# Patient Record
Sex: Female | Born: 1970 | Race: White | Hispanic: No | Marital: Married | Smoking: Former smoker
Health system: Southern US, Community
[De-identification: ages and names within clinical notes are randomized; demographics above are authoritative.]

## PROBLEM LIST (undated history)

## (undated) DIAGNOSIS — E039 Hypothyroidism, unspecified: Secondary | ICD-10-CM

## (undated) DIAGNOSIS — F32A Depression, unspecified: Secondary | ICD-10-CM

## (undated) DIAGNOSIS — F329 Major depressive disorder, single episode, unspecified: Secondary | ICD-10-CM

## (undated) HISTORY — PX: BREAST ENHANCEMENT SURGERY: SHX7

---

## 2008-09-04 ENCOUNTER — Emergency Department (HOSPITAL_COMMUNITY): Admission: EM | Admit: 2008-09-04 | Discharge: 2008-09-04 | Payer: Self-pay | Admitting: Family Medicine

## 2010-04-23 ENCOUNTER — Ambulatory Visit: Payer: Self-pay | Admitting: Internal Medicine

## 2010-04-23 DIAGNOSIS — E039 Hypothyroidism, unspecified: Secondary | ICD-10-CM | POA: Insufficient documentation

## 2010-04-23 DIAGNOSIS — R5383 Other fatigue: Secondary | ICD-10-CM

## 2010-04-23 DIAGNOSIS — N926 Irregular menstruation, unspecified: Secondary | ICD-10-CM

## 2010-04-23 DIAGNOSIS — R5381 Other malaise: Secondary | ICD-10-CM

## 2010-04-23 LAB — CONVERTED CEMR LAB
ALT: 13 units/L (ref 0–35)
AST: 19 units/L (ref 0–37)
BUN: 12 mg/dL (ref 6–23)
CO2: 28 meq/L (ref 19–32)
Chloride: 104 meq/L (ref 96–112)
Creatinine, Ser: 0.8 mg/dL (ref 0.40–1.20)
Free T4: 1.05 ng/dL (ref 0.80–1.80)
HCT: 42.2 % (ref 36.0–46.0)
Hemoglobin: 13.8 g/dL (ref 12.0–15.0)
Potassium: 4 meq/L (ref 3.5–5.3)
RBC: 4.09 M/uL (ref 3.87–5.11)
TSH: 1.656 microintl units/mL (ref 0.350–4.500)
Thyroglobulin Ab: <20 (ref <40.0)
Thyroperoxidase Ab SerPl-aCnc: 15.3 (ref <35.0)
Total Protein: 7.2 g/dL (ref 6.0–8.3)
WBC: 6.8 10*3/uL (ref 4.0–10.5)

## 2010-04-24 ENCOUNTER — Encounter: Payer: Self-pay | Admitting: Internal Medicine

## 2010-04-24 LAB — CONVERTED CEMR LAB: Vitamin B-12: 511 pg/mL (ref 211–911)

## 2010-04-26 ENCOUNTER — Encounter: Payer: Self-pay | Admitting: Internal Medicine

## 2010-04-26 ENCOUNTER — Telehealth: Payer: Self-pay | Admitting: Internal Medicine

## 2010-07-24 NOTE — Letter (Signed)
   [place] [facility] [address] [place], [place]  [address]  [place] Phone: [phone]      April 26, 2010   KYANA AICHER [address] [facility] [place], [place] [address]  RE:  LAB RESULTS  Dear  Ms. Dewaine Conger,  The following is an interpretation of your most recent lab tests.  Please take note of any instructions provided or changes to medications that have resulted from your lab work.  ELECTROLYTES:  Good - no changes needed  KIDNEY FUNCTION TESTS:  Good - no changes needed  LIVER FUNCTION TESTS:  Good - no changes needed  THYROID STUDIES:  Thyroid studies normal, No medication change TSH: 1.656     CBC:  Good - no changes needed  B12 level - normal    I will further discuss your lab results at your next follow up appointment.     Sincerely Yours,    Dr. Thomos Lemons  Appended Document:  mailed

## 2010-07-24 NOTE — Progress Notes (Signed)
Summary: Lab Results  Phone Note Outgoing Call   Summary of Call: informed pt - thyroid antibodies are normal.  TSH is normal.   pt may have had transient thyroiditis in the past I rec pt taper off thyroid medication within 1 month. repeat TSH in 2 months  244.9 Initial call taken by: D. Thomos Lemons DO,  April 27, 2010 1:13 PM  Follow-up for Phone Call        called  placed to patient at [phone], no answer. A detailed voice message was left informing patient per Dr Artist Pais instruction. Message was left for patient to call if any questions Follow-up by: Glendell Docker CMA,  April 27, 2010 3:15 PM

## 2010-07-24 NOTE — Miscellaneous (Signed)
Summary: Vaccine Records/[facility]  Vaccine Records/[facility]   Imported By: Lanelle Bal 05/11/2010 10:19:48  _____________________________________________________________________  External Attachment:    Type:   Image     Comment:   External Document

## 2010-07-24 NOTE — Assessment & Plan Note (Signed)
Summary: new to est patient fasting tricare/[facility]   Vital Signs:  Patient profile:   40 year old female Height:      64 inches Weight:      136.75 pounds BMI:     23.56 O2 Sat:      100 % on Room air Temp:     97.8 degrees F oral Pulse rate:   63 / minute Pulse rhythm:   irregular Resp:     16 per minute BP sitting:   100 / 70  (left arm) Cuff size:   regular  Vitals Entered By: Glendell Docker CMA (April 23, 2010 9:45 AM)  O2 Flow:  Room air CC: New Patient  Is Patient Diabetic? No Pain Assessment Patient in pain? no      Comments establish care and monitor thyroid problem   Primary Care Provider:  Dondra Spry DO  CC:  New Patient .  History of Present Illness: 40 y/o white female to establish  Hypothyroidism diagnosed 2009 fatigue and sluggish, wt gain feeling better since thyroid medication still would like to lose additional wt  wt range - 122 - 140   goal wt 122 - 125 113 - 118 she exercises regularly - swim and run , kick boxing work outs are fairly vigorous - 7-9 in intensity monitors caloric intake - 1500 -1800 right now (2000) does not track protein intake french fries, chips  (salty fried) -  tends to "cheat" when husband is home  (he is freq deployed on [employer])  History of tob use -  1/2 ppd - 1 ppd  (21 yrs) (no chronic cough)  2010 - experienced emotional lability.  Prev PCP attributed to mild depression.  stress of having husband in [employer].  citalopram caused her to feel flat.  doing well on sertraline.  no side effects noted  she has used phentermine as needed in the past for wt loss pt unable to sustain wt loss   Preventive Screening-Counseling & Management  Alcohol-Tobacco     Alcohol drinks/day: 1     Smoking Status: quit     Packs/Day: 1.0     Year Started: 1990     Year Quit: 2011  Caffeine-Diet-Exercise     Caffeine use/day: 4 beverages daily     Does Patient Exercise: yes     Times/week: 4  Allergies  (verified): No Known Drug Allergies  Past History:  Past Medical History: Hypothyroidism - diagnosed in 2009 Hx of heart arrhythmia - S/P Ablation 1999  Past Surgical History: Denies surgical history  Family History: Family History of Colon CA - MGM (diagnosed in her 50's) Family History Diabetes 1st degree relative - father (obese) Family History High cholesterol - mother Family History Hypertension -father Family History Lung cancer - MGM Family History of Stroke M 1st degree relative <50 Aunt - breast cancer  Social History: Married 17 years 3 sons 96, 75, 37  Former smoker - Quit 2011 Alcohol use-yes (social) [place] met Josh at [employer] Smoking Status:  quit Packs/Day:  1.0 Caffeine use/day:  4 beverages daily Does Patient Exercise:  yes  Review of Systems  The patient denies anorexia, fever, weight gain, chest pain, syncope, dyspnea on exertion, prolonged cough, abdominal pain, melena, hematochezia, severe indigestion/heartburn, and depression.         emotional lability over 1 yr,  menstrual cycle somewhat erratic.  sertraline 2010 - (tried citalopram)  Physical Exam  General:  alert, well-developed, and well-nourished.   Head:  normocephalic and atraumatic.   Eyes:  pupils equal, pupils round, and pupils reactive to light.   Ears:  R ear normal and L ear normal.   Mouth:  pharynx pink and moist.   Neck:  No deformities, masses, or tenderness noted.no thyromegaly and no thyroid nodules or tenderness.   Lungs:  normal respiratory effort and normal breath sounds.   Heart:  normal rate, regular rhythm, and no gallop.   Abdomen:  soft, non-tender, normal bowel sounds, no masses, no hepatomegaly, and no splenomegaly.   Extremities:  No lower extremity edema  Neurologic:  cranial nerves II-XII intact and gait normal.   Psych:  normally interactive, good eye contact, not anxious appearing, and not depressed appearing.     Impression &  Recommendations:  Problem # 1:  HYPOTHYROIDISM (ICD-244.9) hx of hypothyroidism.  check thyroid antibodies.      Orders: T-TSH [phone]) T-T4, Free [phone]) T- * Misc. Laboratory test [phone])  Problem # 2:  PREVENTIVE HEALTH CARE (ICD-V70.0)  Orders: Mammogram (Screening) (Mammo) T-CBC No Diff ([id number])  Pap smear: normal (04/08/2006) Td Booster: Historical (04/12/2008)   Flu Vax: Historical (03/13/2010)     Problem # 3:  IRREGULAR MENSES (ICD-626.4) I doubt pt peri menopausal.  irregular periods likely assoc with vigorous exercise  Orders: T-FSH ([id number]) T-LH ([id number])  Complete Medication List: 1)  Sertraline Hcl 50 Mg Tabs (Sertraline hcl) .... Take 1 tablet by mouth once a day 2)  Phentermine Hcl 15 Mg Caps (Phentermine hcl) .... Take 1 capsule by mouth once a day 3)  B Complex Vitamins Caps (B complex vitamins) .... Take 1 capsule by mouth once a day  Other Orders: T-Basic Metabolic Panel [phone]) T-Hepatic Function [phone])  Patient Instructions: 1)  Please schedule PAP / Pelvic within next 1-2 months 2)  Please schedule a follow-up appointment in 3 months.   Orders Added: 1)  Mammogram (Screening) [Mammo] 2)  T-TSH [[id number]] 3)  T-T4, Free [[id number]] 4)  T- * Misc. Laboratory test [99999] 5)  T-CBC No Diff [[id number]] 6)  T-Basic Metabolic Panel [[id number]] 7)  T-Hepatic Function [[id number]] 8)  T-FSH [[id number]] 9)  T-LH [[id number]] 10)  New Patient Level III [10932]   Immunization History:  Influenza Immunization History:    Influenza:  historical (03/13/2010)   Immunization History:  Influenza Immunization History:    Influenza:  Historical (03/13/2010)   Preventive Care Screening  Last Tetanus Booster:    Date:  04/12/2008    Results:  Historical   Pap Smear:    Date:  04/08/2006    Results:  normal    Current Allergies (reviewed today): No known allergies

## 2011-07-13 ENCOUNTER — Encounter (HOSPITAL_BASED_OUTPATIENT_CLINIC_OR_DEPARTMENT_OTHER): Payer: Self-pay

## 2011-07-13 ENCOUNTER — Emergency Department (INDEPENDENT_AMBULATORY_CARE_PROVIDER_SITE_OTHER)

## 2011-07-13 ENCOUNTER — Emergency Department (HOSPITAL_BASED_OUTPATIENT_CLINIC_OR_DEPARTMENT_OTHER)
Admission: EM | Admit: 2011-07-13 | Discharge: 2011-07-13 | Disposition: A | Discharge status: Home / Self Care | Attending: Emergency Medicine | Admitting: Emergency Medicine

## 2011-07-13 DIAGNOSIS — Z79899 Other long term (current) drug therapy: Secondary | ICD-10-CM | POA: Insufficient documentation

## 2011-07-13 DIAGNOSIS — S139XXA Sprain of joints and ligaments of unspecified parts of neck, initial encounter: Secondary | ICD-10-CM | POA: Insufficient documentation

## 2011-07-13 DIAGNOSIS — E039 Hypothyroidism, unspecified: Secondary | ICD-10-CM | POA: Insufficient documentation

## 2011-07-13 DIAGNOSIS — T07XXXA Unspecified multiple injuries, initial encounter: Secondary | ICD-10-CM

## 2011-07-13 DIAGNOSIS — IMO0002 Reserved for concepts with insufficient information to code with codable children: Secondary | ICD-10-CM | POA: Insufficient documentation

## 2011-07-13 DIAGNOSIS — W19XXXA Unspecified fall, initial encounter: Secondary | ICD-10-CM

## 2011-07-13 DIAGNOSIS — S0510XA Contusion of eyeball and orbital tissues, unspecified eye, initial encounter: Secondary | ICD-10-CM

## 2011-07-13 DIAGNOSIS — F3289 Other specified depressive episodes: Secondary | ICD-10-CM | POA: Insufficient documentation

## 2011-07-13 DIAGNOSIS — W010XXA Fall on same level from slipping, tripping and stumbling without subsequent striking against object, initial encounter: Secondary | ICD-10-CM | POA: Insufficient documentation

## 2011-07-13 DIAGNOSIS — Y998 Other external cause status: Secondary | ICD-10-CM | POA: Insufficient documentation

## 2011-07-13 DIAGNOSIS — F329 Major depressive disorder, single episode, unspecified: Secondary | ICD-10-CM | POA: Insufficient documentation

## 2011-07-13 DIAGNOSIS — S161XXA Strain of muscle, fascia and tendon at neck level, initial encounter: Secondary | ICD-10-CM

## 2011-07-13 HISTORY — DX: Depression, unspecified: F32.A

## 2011-07-13 HISTORY — DX: Hypothyroidism, unspecified: E03.9

## 2011-07-13 HISTORY — DX: Major depressive disorder, single episode, unspecified: F32.9

## 2011-07-13 MED ORDER — TETANUS-DIPHTH-ACELL PERTUSSIS 5-2.5-18.5 LF-MCG/0.5 IM SUSP
0.5000 mL | Freq: Once | INTRAMUSCULAR | Status: AC
  Administered 2011-07-13: 0.5 mL via INTRAMUSCULAR
  Filled 2011-07-13: qty 0.5

## 2011-07-13 MED ORDER — CARISOPRODOL 350 MG PO TABS: 350.0000 mg | ORAL_TABLET | Freq: Three times a day (TID) | ORAL | Status: AC

## 2011-07-13 MED ORDER — IBUPROFEN 800 MG PO TABS
800.0000 mg | ORAL_TABLET | Freq: Once | ORAL | Status: AC
  Administered 2011-07-13: 800 mg via ORAL
  Filled 2011-07-13: qty 1

## 2011-07-13 MED ORDER — HYDROCODONE-ACETAMINOPHEN 5-500 MG PO TABS: 1.0000 | ORAL_TABLET | Freq: Four times a day (QID) | ORAL | Status: AC | PRN

## 2011-07-13 NOTE — ED Provider Notes (Signed)
History     CSN: [id number]  Arrival date & time 07/13/11  1016   First MD Initiated Contact with Patient 07/13/11 1134      Chief Complaint  Patient presents with  . Fall    (Consider location/radiation/quality/duration/timing/severity/associated sxs/prior treatment) HPI Comments: Patient presents to the emergency department after sustaining a fall last night. She states that she had been drinking heavily she had 3 glasses of wine. She was walking outside and slipped on some ice. She states that she fell forward into some gravel. Although she did not remember the entire incident. She does think that she passed out. Complains of pain primarily to the right side of her neck. Denies any numbness or weakness in her extremities. Denies any radiation down her arm. Denies any bony tenderness her face. Denies vision changes. Denies any other extremity injuries. She states that the pain in her neck is worse when turning her head to the right.  The history is provided by the patient.    Past Medical History  Diagnosis Date  . Hypothyroid   . Depression     History reviewed. No pertinent past surgical history.  No family history on file.  History  Substance Use Topics  . Smoking status: Former Games developer  . Smokeless tobacco: Not on file  . Alcohol Use: Yes    OB History    Grav Para Term Preterm Abortions TAB SAB Ect Mult Living                  Review of Systems  Constitutional: Negative for fever, chills, diaphoresis and fatigue.  HENT: Negative for congestion, rhinorrhea and sneezing.   Eyes: Negative.  Negative for pain, redness and visual disturbance.  Respiratory: Negative for cough, chest tightness and shortness of breath.   Cardiovascular: Negative for chest pain and leg swelling.  Gastrointestinal: Negative for nausea, vomiting, abdominal pain, diarrhea and blood in stool.  Genitourinary: Negative for frequency, hematuria, flank pain and difficulty urinating.    Musculoskeletal: Negative for back pain and arthralgias.  Skin: Positive for wound. Negative for rash.  Neurological: Negative for dizziness, speech difficulty, weakness, numbness and headaches.    Allergies  Review of patient's allergies indicates no known allergies.  Home Medications   Current Outpatient Rx  Name Route Sig Dispense Refill  . VITAMIN B 12 PO Oral Take by mouth.    Marland Kitchen LEVOTHYROXINE SODIUM PO Oral Take by mouth.    . SERTRALINE HCL PO Oral Take by mouth.    Marland Kitchen CARISOPRODOL 350 MG PO TABS Oral Take 1 tablet (350 mg total) by mouth 3 (three) times daily. 15 tablet 0  . HYDROCODONE-ACETAMINOPHEN 5-500 MG PO TABS Oral Take 1-2 tablets by mouth every 6 (six) hours as needed for pain. 15 tablet 0    BP 113/74  Pulse 77  Temp(Src) 98 F (36.7 C) (Oral)  Resp 18  Ht 5\' 4"  (1.626 m)  Wt 131 lb (59.421 kg)  BMI 22.49 kg/m2  SpO2 100%  LMP 07/12/2011  Physical Exam  Constitutional: She is oriented to person, place, and time. She appears well-developed and well-nourished.  HENT:  Head: Normocephalic.  Right Ear: External ear normal.  Left Ear: External ear normal.  Nose: Nose normal.  Mouth/Throat: Oropharynx is clear and moist.       Patient has multiple abrasions to the right side of her face. There is no lacerations that need suturing. Extraocular eye movements are intact. No redness or apparent ocular injuries are noted.  No bleeding from the nares is noted. There is no bony tenderness on palpation of the face. The teeth appear intact.  Eyes: Pupils are equal, round, and reactive to light.  Neck: Normal range of motion. Neck supple.  Cardiovascular: Normal rate, regular rhythm and normal heart sounds.   Pulmonary/Chest: Effort normal and breath sounds normal. No respiratory distress. She has no wheezes. She has no rales. She exhibits no tenderness.  Abdominal: Soft. Bowel sounds are normal. There is no tenderness. There is no rebound and no guarding.   Musculoskeletal: Normal range of motion. She exhibits no edema.       There is positive tenderness to palpation to the right trapezius muscle and along the cervical spine at the midline. No step-offs or deformities are noted. There is no pain to the thoracic or lumbosacral spine. There is no bony tenderness noted to the extremities  Lymphadenopathy:    She has no cervical adenopathy.  Neurological: She is alert and oriented to person, place, and time.  Skin: Skin is warm and dry. No rash noted.  Psychiatric: She has a normal mood and affect.    ED Course  Procedures (including critical care time)  Labs Reviewed - No data to display Ct Head Wo Contrast  07/13/2011  *RADIOLOGY REPORT*  Clinical Data:  Fall, bruising around right eye, abrasions to right head  CT HEAD WITHOUT CONTRAST CT CERVICAL SPINE WITHOUT CONTRAST  Technique:  Multidetector CT imaging of the head and cervical spine was performed following the standard protocol without intravenous contrast.  Multiplanar CT image reconstructions of the cervical spine were also generated.  Comparison:  None  CT HEAD  Findings: No evidence of parenchymal hemorrhage or extra-axial fluid collection. No mass lesion, mass effect, or midline shift.  No CT evidence of acute infarction.  Cerebral volume is age appropriate.  No ventriculomegaly.  The visualized paranasal sinuses are essentially clear. The mastoid air cells are unopacified.  No evidence of calvarial fracture.  IMPRESSION: No evidence of acute intracranial abnormality.  CT CERVICAL SPINE  Findings: Mild straightening of the cervical spine, likely positional.  No evidence of fracture or dislocation.  Vertebral body heights and intervertebral disc spaces are maintained.  The dens appears intact.  No prevertebral soft tissue swelling.  Visualized thyroid is unremarkable.  IMPRESSION: No evidence of traumatic injury to the cervical spine.  Original Report Authenticated By: Charline Bills, M.D.    Ct Cervical Spine Wo Contrast  07/13/2011  *RADIOLOGY REPORT*  Clinical Data:  Fall, bruising around right eye, abrasions to right head  CT HEAD WITHOUT CONTRAST CT CERVICAL SPINE WITHOUT CONTRAST  Technique:  Multidetector CT imaging of the head and cervical spine was performed following the standard protocol without intravenous contrast.  Multiplanar CT image reconstructions of the cervical spine were also generated.  Comparison:  None  CT HEAD  Findings: No evidence of parenchymal hemorrhage or extra-axial fluid collection. No mass lesion, mass effect, or midline shift.  No CT evidence of acute infarction.  Cerebral volume is age appropriate.  No ventriculomegaly.  The visualized paranasal sinuses are essentially clear. The mastoid air cells are unopacified.  No evidence of calvarial fracture.  IMPRESSION: No evidence of acute intracranial abnormality.  CT CERVICAL SPINE  Findings: Mild straightening of the cervical spine, likely positional.  No evidence of fracture or dislocation.  Vertebral body heights and intervertebral disc spaces are maintained.  The dens appears intact.  No prevertebral soft tissue swelling.  Visualized thyroid is unremarkable.  IMPRESSION: No evidence of traumatic injury to the cervical spine.  Original Report Authenticated By: Charline Bills, M.D.     1. Cervical strain   2. Abrasions of multiple sites       MDM  Patient had no evidence of fracture or to the spine. No bony tenderness to the face to indicate fracture. No intracranial injuries noted. Will give prescriptions for symptomatic treatment of the muscle strain. Given wound care instructions for the abrasions. Her tetanus shot was updated. We'll followup with her primary care provider as needed.        Rolan Bucco, MD 07/13/11 [phone]

## 2011-07-13 NOTE — ED Notes (Signed)
Pt c/o fall last evening.  Pt states she slipped outside and fell, striking R side of face.  Pt unaware of LOC.  Tetanus unknown.  Denies loose teeth.

## 2012-01-25 ENCOUNTER — Encounter (HOSPITAL_BASED_OUTPATIENT_CLINIC_OR_DEPARTMENT_OTHER): Payer: Self-pay | Admitting: Emergency Medicine

## 2012-01-25 ENCOUNTER — Emergency Department (HOSPITAL_BASED_OUTPATIENT_CLINIC_OR_DEPARTMENT_OTHER)

## 2012-01-25 ENCOUNTER — Emergency Department (HOSPITAL_BASED_OUTPATIENT_CLINIC_OR_DEPARTMENT_OTHER)
Admission: EM | Admit: 2012-01-25 | Discharge: 2012-01-25 | Disposition: A | Discharge status: Home / Self Care | Attending: Emergency Medicine | Admitting: Emergency Medicine

## 2012-01-25 DIAGNOSIS — E039 Hypothyroidism, unspecified: Secondary | ICD-10-CM | POA: Insufficient documentation

## 2012-01-25 DIAGNOSIS — F329 Major depressive disorder, single episode, unspecified: Secondary | ICD-10-CM | POA: Insufficient documentation

## 2012-01-25 DIAGNOSIS — F3289 Other specified depressive episodes: Secondary | ICD-10-CM | POA: Insufficient documentation

## 2012-01-25 DIAGNOSIS — F17200 Nicotine dependence, unspecified, uncomplicated: Secondary | ICD-10-CM | POA: Insufficient documentation

## 2012-01-25 DIAGNOSIS — R079 Chest pain, unspecified: Secondary | ICD-10-CM

## 2012-01-25 LAB — URINALYSIS, ROUTINE W REFLEX MICROSCOPIC
Hgb urine dipstick: NEGATIVE
Leukocytes, UA: NEGATIVE
Nitrite: NEGATIVE
Protein, ur: NEGATIVE mg/dL
Specific Gravity, Urine: 1.004 — ABNORMAL LOW (ref 1.005–1.030)
Urobilinogen, UA: 0.2 mg/dL (ref 0.0–1.0)

## 2012-01-25 LAB — COMPREHENSIVE METABOLIC PANEL
AST: 23 U/L (ref 0–37)
BUN: 11 mg/dL (ref 6–23)
CO2: 24 mEq/L (ref 19–32)
Calcium: 8.6 mg/dL (ref 8.4–10.5)
Chloride: 106 mEq/L (ref 96–112)
Creatinine, Ser: 0.8 mg/dL (ref 0.50–1.10)
GFR calc Af Amer: >90 mL/min (ref >90)
GFR calc non Af Amer: >90 mL/min (ref >90)
Glucose, Bld: 99 mg/dL (ref 70–99)
Total Bilirubin: 0.5 mg/dL (ref 0.3–1.2)

## 2012-01-25 LAB — CBC WITH DIFFERENTIAL/PLATELET
Basophils Absolute: 0 10*3/uL (ref 0.0–0.1)
Eosinophils Relative: 0 % (ref 0–5)
HCT: 35.5 % — ABNORMAL LOW (ref 36.0–46.0)
Hemoglobin: 12.3 g/dL (ref 12.0–15.0)
Lymphocytes Relative: 34 % (ref 12–46)
MCHC: 34.6 g/dL (ref 30.0–36.0)
MCV: 98.6 fL (ref 78.0–100.0)
Monocytes Absolute: 0.6 10*3/uL (ref 0.1–1.0)
Monocytes Relative: 8 % (ref 3–12)
Neutro Abs: 4.1 10*3/uL (ref 1.7–7.7)
RDW: 11.6 % (ref 11.5–15.5)
WBC: 7.1 10*3/uL (ref 4.0–10.5)

## 2012-01-25 LAB — TROPONIN I: Troponin I: <0.3 ng/mL (ref <0.30)

## 2012-01-25 LAB — PREGNANCY, URINE: Preg Test, Ur: NEGATIVE

## 2012-01-25 MED ORDER — HYDROCODONE-ACETAMINOPHEN 5-325 MG PO TABS: 1.0000 | ORAL_TABLET | ORAL | Status: AC | PRN

## 2012-01-25 MED ORDER — IOHEXOL 350 MG/ML SOLN
100.0000 mL | Freq: Once | INTRAVENOUS | Status: AC | PRN
  Administered 2012-01-25: 100 mL via INTRAVENOUS

## 2012-01-25 MED ORDER — SODIUM CHLORIDE 0.9 % IV SOLN
INTRAVENOUS | Status: DC
  Administered 2012-01-25: 11:00:00 via INTRAVENOUS

## 2012-01-25 MED ORDER — CYCLOBENZAPRINE HCL 10 MG PO TABS: 10.0000 mg | ORAL_TABLET | Freq: Three times a day (TID) | ORAL | Status: AC | PRN

## 2012-01-25 NOTE — ED Notes (Signed)
Pt c/o pain to LT rib area w/ inspiration & movement; no known injury; no other sx

## 2012-01-25 NOTE — ED Provider Notes (Signed)
History     CSN: [id number]  Arrival date & time 01/25/12  4098   First MD Initiated Contact with Patient 01/25/12 [phone]      Chief Complaint  Patient presents with  . Chest Pain    (Consider location/radiation/quality/duration/timing/severity/associated sxs/prior treatment) HPI Comments: The patient is a 41 year old woman who woke up with a pain in her left lateral chest wall. The pain is worsened with a deep breath. She tried aspirin and Gas-X without relief. She was all right yesterday. There was no injury. She's had no fever cough she's had no prior similar episodes. She does smoke occasionally. She uses the NuvaRing for birth control.  Patient is a 41 y.o. female presenting with chest pain. The history is provided by the patient. No language interpreter was used.  Chest Pain The chest pain began 1 - 2 hours ago. Episode frequency: The pain is constantly present but accentuated by taking a deep breath. The chest pain is unchanged. The pain is associated with breathing and coughing. The severity of the pain is moderate. The quality of the pain is described as sharp. The pain does not radiate. Chest pain is worsened by deep breathing. Pertinent negatives for primary symptoms include no fever, no shortness of breath and no cough.  Associated symptoms include lower extremity edema. She tried aspirin and antacids for the symptoms. Risk factors include hormone replacement therapy. Past medical history comments: Hypothyroidism.     Past Medical History  Diagnosis Date  . Hypothyroid   . Depression     Past Surgical History  Procedure Date  . Breast enhancement surgery     No family history on file.  History  Substance Use Topics  . Smoking status: Current Some Day Smoker  . Smokeless tobacco: Not on file  . Alcohol Use: No    OB History    Grav Para Term Preterm Abortions TAB SAB Ect Mult Living                  Review of Systems  Constitutional: Negative for fever and  chills.  HENT: Negative.   Eyes: Negative.   Respiratory: Negative for cough and shortness of breath.   Cardiovascular: Positive for chest pain. Negative for leg swelling.  Gastrointestinal: Negative.   Genitourinary: Negative.   Musculoskeletal: Negative.   Skin: Negative.   Neurological: Negative.   Psychiatric/Behavioral: Negative.     Allergies  Review of patient's allergies indicates no known allergies.  Home Medications   Current Outpatient Rx  Name Route Sig Dispense Refill  . VITAMIN B 12 PO Oral Take by mouth.    Marland Kitchen LEVOTHYROXINE SODIUM PO Oral Take by mouth.    . SERTRALINE HCL PO Oral Take by mouth.      BP 123/66  Pulse 79  Temp 98 F (36.7 C) (Oral)  Resp 18  SpO2 100%  LMP 12/25/2011  Physical Exam  Nursing note and vitals reviewed. Constitutional: She is oriented to person, place, and time. She appears well-developed and well-nourished.       She has moderate distress when she takes a deep breath, with pain felt in the left lateral chest wall.  HENT:  Head: Normocephalic and atraumatic.  Right Ear: External ear normal.  Left Ear: External ear normal.  Mouth/Throat: Oropharynx is clear and moist.  Eyes: Conjunctivae and EOM are normal. Pupils are equal, round, and reactive to light.  Neck: Normal range of motion. Neck supple.  Cardiovascular: Normal rate, regular rhythm and normal heart  sounds.   Pulmonary/Chest: Effort normal and breath sounds normal. She has no wheezes. She has no rales. She exhibits no tenderness.  Abdominal: Soft. Bowel sounds are normal.  Musculoskeletal: Normal range of motion. She exhibits no edema and no tenderness.  Neurological: She is alert and oriented to person, place, and time.       No sensory or motor deficit.  Skin: Skin is warm and dry.  Psychiatric: She has a normal mood and affect. Her behavior is normal.    ED Course  Procedures (including critical care time)  Labs Reviewed - No data to display Dg Chest 2  View  01/25/2012  *RADIOLOGY REPORT*  Clinical Data: Chest pain.  CHEST - 2 VIEW  Comparison: No priors.  Findings: Lung volumes are normal.  No consolidative airspace disease.  No pleural effusions.  No pneumothorax.  No pulmonary nodule or mass noted.  Pulmonary vasculature and the cardiomediastinal silhouette are within normal limits.  Bilateral breast implants are incidentally noted.  IMPRESSION: 1. No radiographic evidence of acute cardiopulmonary disease.  Original Report Authenticated By: Florencia Reasons, M.D.   10:32 AM Chest x-ry negative.  Will do lab work, EKG, and CT angio of chest, where she uses the [place].  10:52 AM  Date: 01/25/2012  Rate: 58  Rhythm: normal sinus rhythm  QRS Axis: normal  Intervals: normal  ST/T Wave abnormalities: normal  Conduction Disutrbances:none  Narrative Interpretation: Normal EKG  Old EKG Reviewed: none available  12:26 PM Results for orders placed during the hospital encounter of 01/25/12  CBC WITH DIFFERENTIAL      Component Value Range   WBC 7.1  4.0 - 10.5 K/uL   RBC 3.60 (*) 3.87 - 5.11 MIL/uL   Hemoglobin 12.3  12.0 - 15.0 g/dL   HCT 78.2 (*) 95.6 - 21.3 %   MCV 98.6  78.0 - 100.0 fL   MCH 34.2 (*) 26.0 - 34.0 pg   MCHC 34.6  30.0 - 36.0 g/dL   RDW 08.6  57.8 - 46.9 %   Platelets 192  150 - 400 K/uL   Neutrophils Relative 57  43 - 77 %   Neutro Abs 4.1  1.7 - 7.7 K/uL   Lymphocytes Relative 34  12 - 46 %   Lymphs Abs 2.4  0.7 - 4.0 K/uL   Monocytes Relative 8  3 - 12 %   Monocytes Absolute 0.6  0.1 - 1.0 K/uL   Eosinophils Relative 0  0 - 5 %   Eosinophils Absolute 0.0  0.0 - 0.7 K/uL   Basophils Relative 0  0 - 1 %   Basophils Absolute 0.0  0.0 - 0.1 K/uL  COMPREHENSIVE METABOLIC PANEL      Component Value Range   Sodium 139  135 - 145 mEq/L   Potassium 5.0  3.5 - 5.1 mEq/L   Chloride 106  96 - 112 mEq/L   CO2 24  19 - 32 mEq/L   Glucose, Bld 99  70 - 99 mg/dL   BUN 11  6 - 23 mg/dL   Creatinine, Ser 6.29  0.50 -  1.10 mg/dL   Calcium 8.6  8.4 - 52.8 mg/dL   Total Protein 6.6  6.0 - 8.3 g/dL   Albumin 3.4 (*) 3.5 - 5.2 g/dL   AST 23  0 - 37 U/L   ALT 16  0 - 35 U/L   Alkaline Phosphatase 37 (*) 39 - 117 U/L   Total Bilirubin 0.5  0.3 -  1.2 mg/dL   GFR calc non Af Amer >90  >90 mL/min   GFR calc Af Amer >90  >90 mL/min  URINALYSIS, ROUTINE W REFLEX MICROSCOPIC      Component Value Range   Color, Urine YELLOW  YELLOW   APPearance CLEAR  CLEAR   Specific Gravity, Urine 1.004 (*) 1.005 - 1.030   pH 7.0  5.0 - 8.0   Glucose, UA NEGATIVE  NEGATIVE mg/dL   Hgb urine dipstick NEGATIVE  NEGATIVE   Bilirubin Urine NEGATIVE  NEGATIVE   Ketones, ur NEGATIVE  NEGATIVE mg/dL   Protein, ur NEGATIVE  NEGATIVE mg/dL   Urobilinogen, UA 0.2  0.0 - 1.0 mg/dL   Nitrite NEGATIVE  NEGATIVE   Leukocytes, UA NEGATIVE  NEGATIVE  PREGNANCY, URINE      Component Value Range   Preg Test, Ur NEGATIVE  NEGATIVE  TROPONIN I      Component Value Range   Troponin I <0.30  <0.30 ng/mL   Dg Chest 2 View  01/25/2012  *RADIOLOGY REPORT*  Clinical Data: Chest pain.  CHEST - 2 VIEW  Comparison: No priors.  Findings: Lung volumes are normal.  No consolidative airspace disease.  No pleural effusions.  No pneumothorax.  No pulmonary nodule or mass noted.  Pulmonary vasculature and the cardiomediastinal silhouette are within normal limits.  Bilateral breast implants are incidentally noted.  IMPRESSION: 1. No radiographic evidence of acute cardiopulmonary disease.  Original Report Authenticated By: Florencia Reasons, M.D.   Ct Angio Chest W/cm &/or Wo Cm  01/25/2012  *RADIOLOGY REPORT*  Clinical Data: Sudden onset left chest pain, pleuritic  CT ANGIOGRAPHY CHEST  Technique:  Multidetector CT imaging of the chest using the standard protocol during bolus administration of intravenous contrast. Multiplanar reconstructed images including MIPs were obtained and reviewed to evaluate the vascular anatomy.  Contrast: OMNIPAQUE IOHEXOL 350  MG/ML SOLN  Comparison: Chest radiographs dated 01/25/2012  Findings: No evidence of pulmonary embolism.  5 mm right upper lobe pure ground-glass nodule (series 6/image 26). Lungs are otherwise clear. No pleural effusion or pneumothorax.  Visualized thyroid is unremarkable.  The heart is normal in size.  No pericardial effusion.  No suspicious mediastinal, hilar, or axillary lymphadenopathy.  Visualized upper abdomen is unremarkable.  Bilateral breast augmentation.  Very mild degenerative changes of the visualized thoracolumbar spine.  IMPRESSION: No evidence of pulmonary embolism.  5 mm right upper lobe pure ground-glass nodule, likely benign.  No dedicated follow-up imaging is required per Fleischner Society guidelines.  No evidence of acute cardiopulmonary disease.  Above recommendation follows the consensus statement: Recommendations for the Management of Subsolid Pulmonary Nodules Detected at CT:  A Statement from the Fleischner Society. Radiology 2013; 266:304-317.  Original Report Authenticated By: Charline Bills, M.D.    12:27 PM Lab tests, EKG, and CT angio of chest are negative.  Rx for hydrocodone-acetaminophen, advised pt she needs to stop smoking.    1. Nonspecific chest pain          Carleene Cooper III, MD 01/25/12 [phone]

## 2012-12-03 ENCOUNTER — Other Ambulatory Visit: Payer: Self-pay | Admitting: Family Medicine

## 2012-12-03 DIAGNOSIS — Z9889 Other specified postprocedural states: Secondary | ICD-10-CM

## 2012-12-04 ENCOUNTER — Ambulatory Visit
Admission: RE | Admit: 2012-12-04 | Discharge: 2012-12-04 | Disposition: A | Discharge status: Home / Self Care | Source: Ambulatory Visit | Attending: Family Medicine | Admitting: Family Medicine

## 2012-12-04 DIAGNOSIS — Z8719 Personal history of other diseases of the digestive system: Secondary | ICD-10-CM

## 2013-11-29 IMAGING — RF DG ESOPHAGUS
10 series · 20 of 24 positions shown · non-contrast
Comparison: None.

CLINICAL DATA: Possible esophageal narrowing, sensation of food
sticking in throat

ESOPHAGUS/BARIUM SWALLOW/TABLET STUDY
Fluoroscopy Time: 1 minute 12 seconds

[Series 1: run · 2 of 6 slices shown (1 of 10)]
[im 1/6]
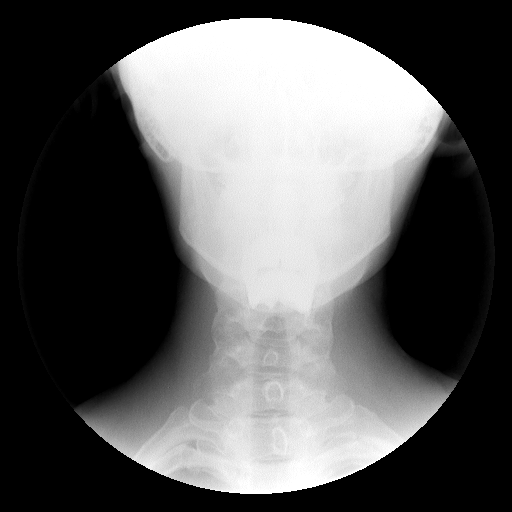
[im 3/6]
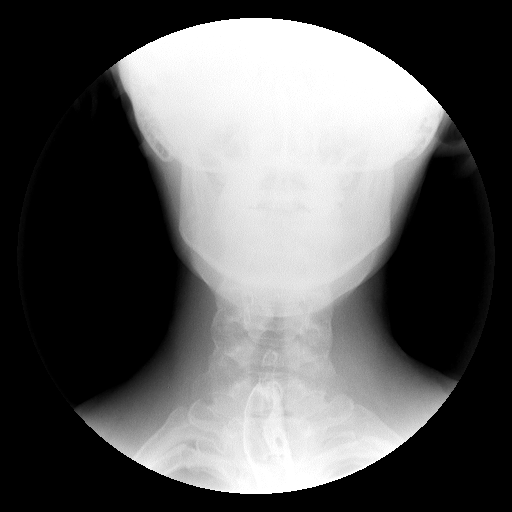

[Series 2: run · 2 of 5 slices shown (2 of 10)]
[im 1/5]
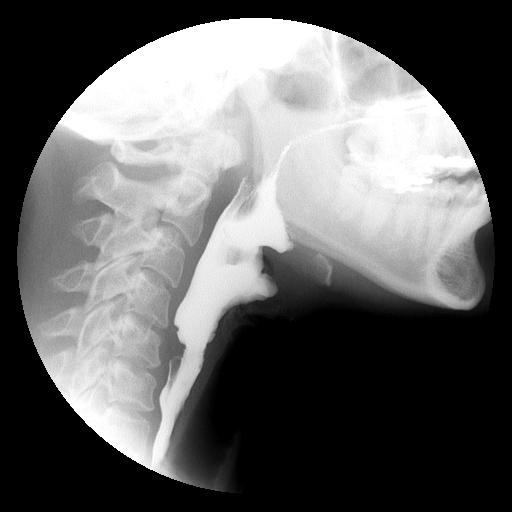
[im 5/5]
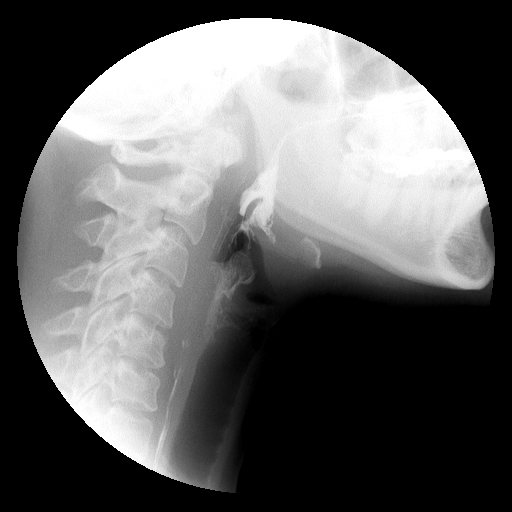

[Series 3: run · 3 of 8 slices shown (3 of 10)]
[im 1/8]
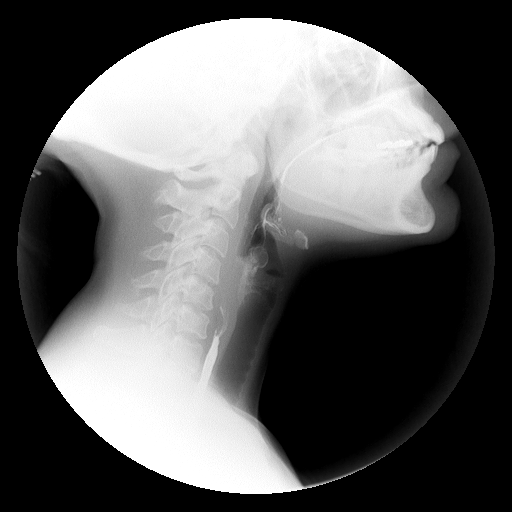
[im 4/8]
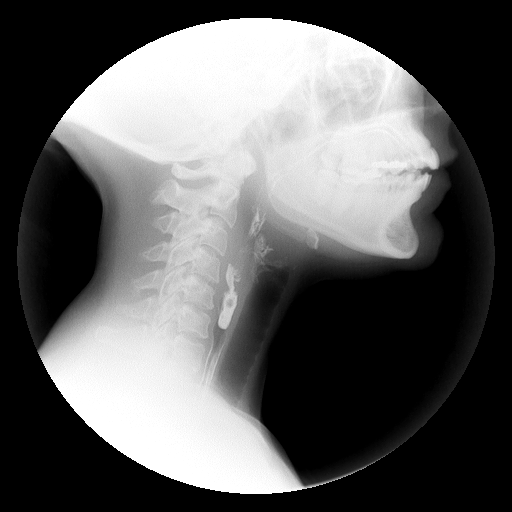
[im 8/8]
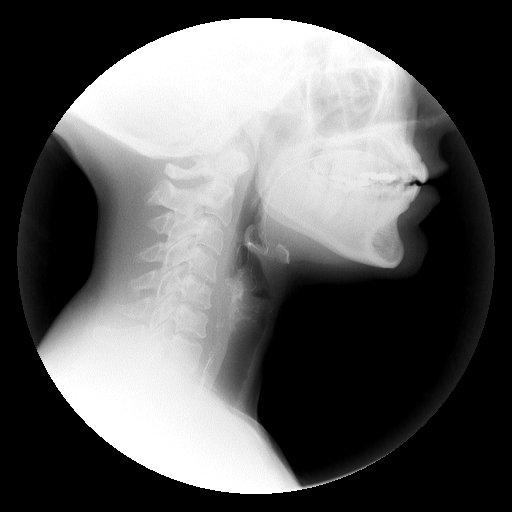

[Series 4: run · 2 of 7 slices shown (4 of 10)]
[im 4/7]
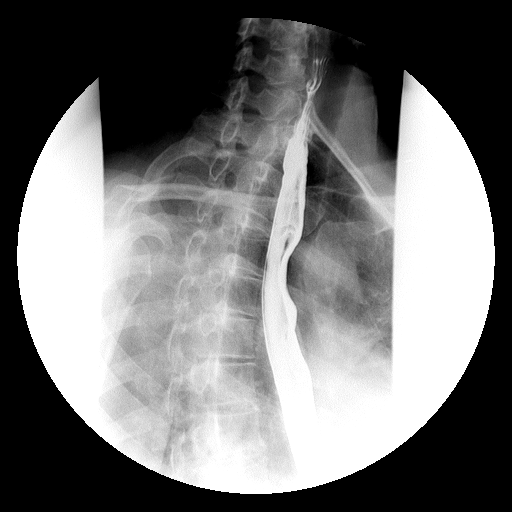
[im 7/7]
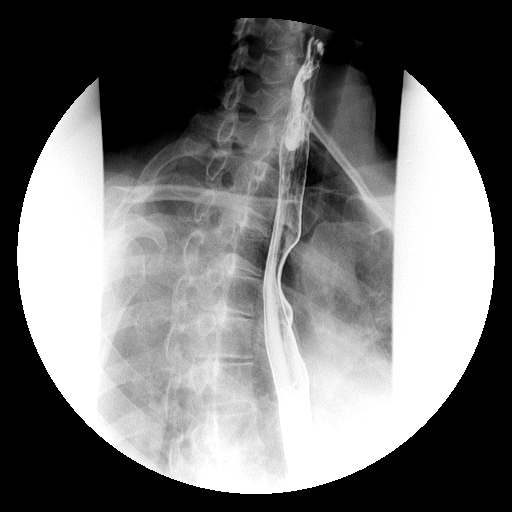

[Series 5: run · 1 of 3 slices shown (5 of 10)]
[im 1/3]
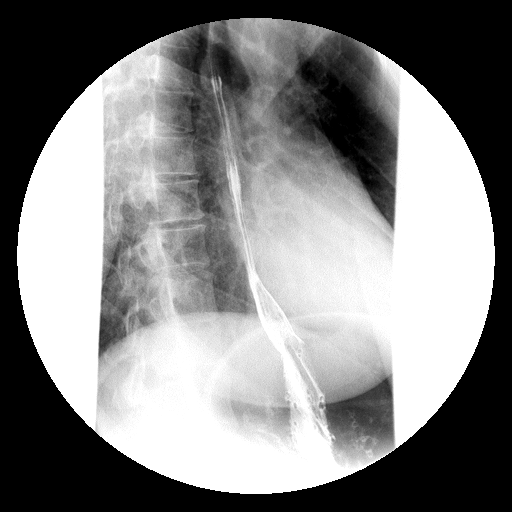

[Series 6: run · 2 of 8 slices shown (6 of 10)]
[im 1/8]
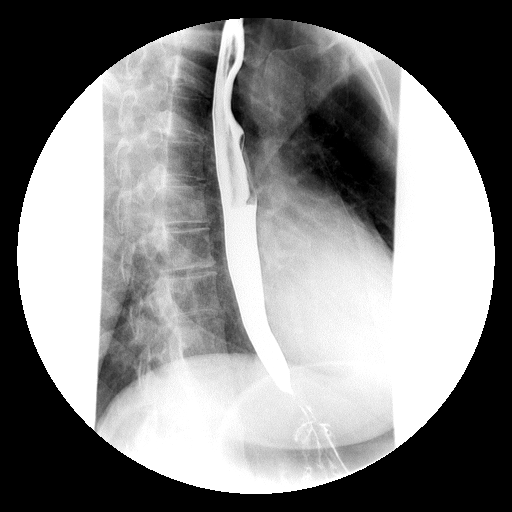
[im 4/8]
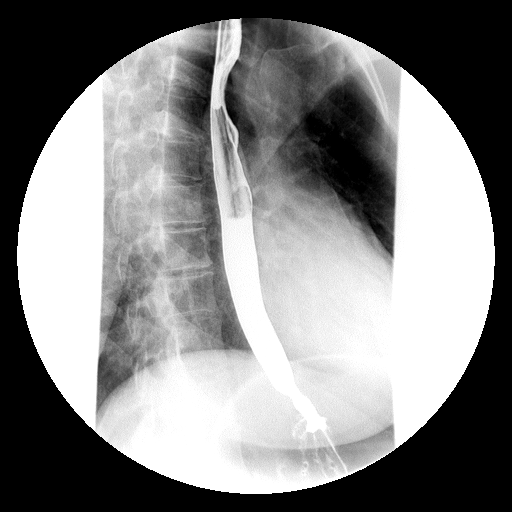

[Series 7: run · 3 of 6 slices shown (7 of 10)]
[im 1/6]
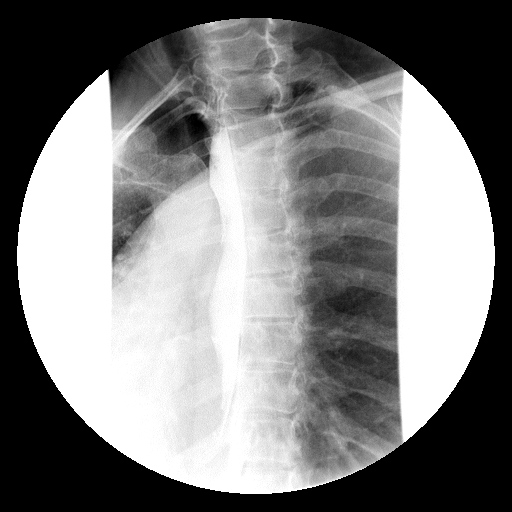
[im 3/6]
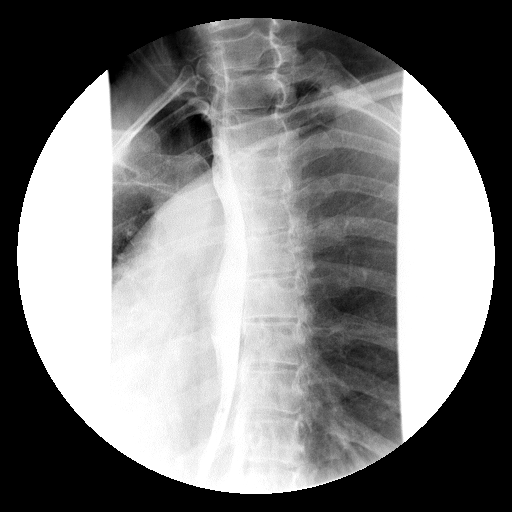
[im 6/6]
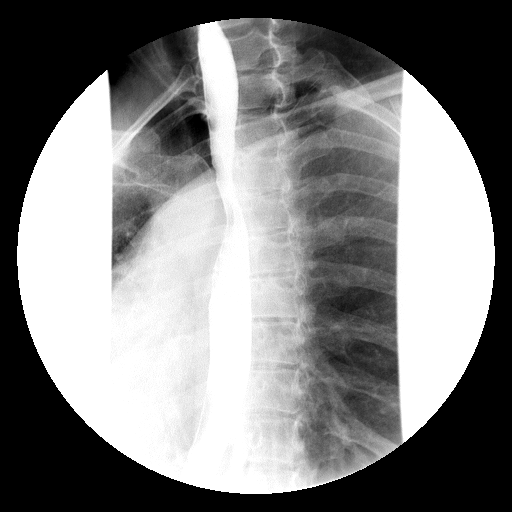

[Series 8: run · 3 of 9 slices shown (8 of 10)]
[im 1/9]
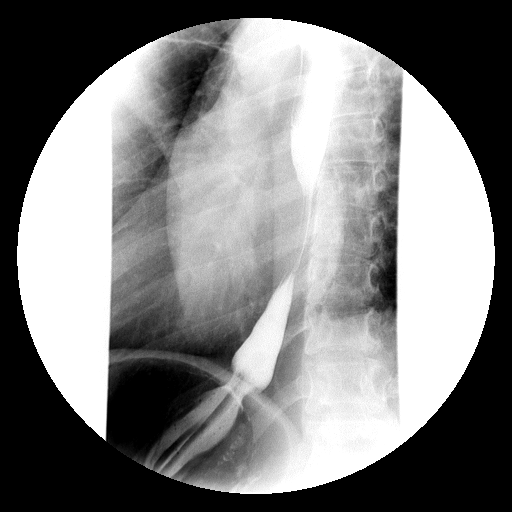
[im 3/9]
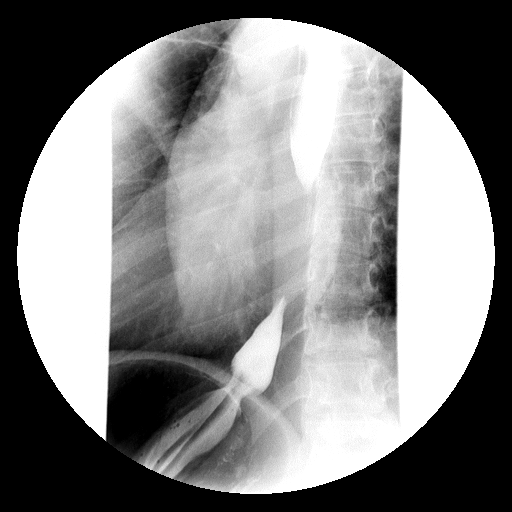
[im 9/9]
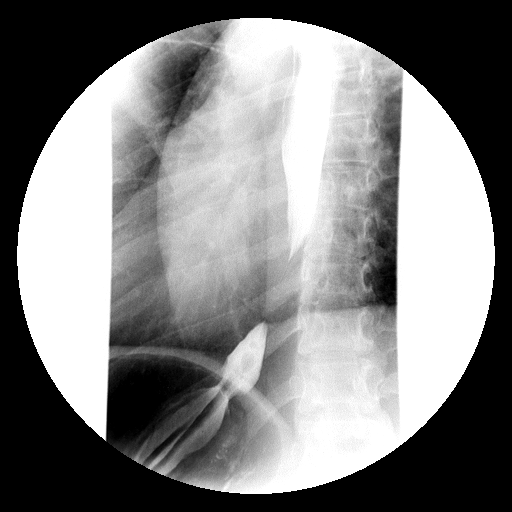

[Series 9: run · 1 of 3 slices shown (9 of 10)]
[im 1/3]
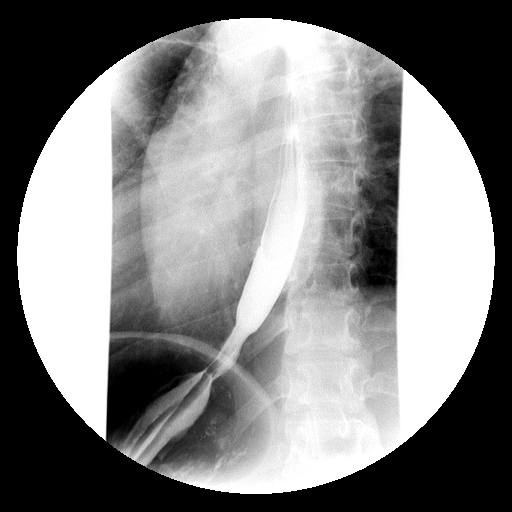

[Series 10: run · 1 of 2 slices shown (10 of 10)]
[im 1/2]
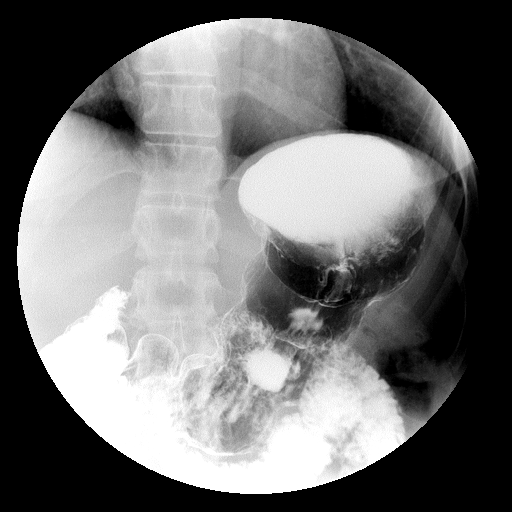

[20 of 24 positions shown; findings below may reference images not displayed]

FINDINGS: Normal oral phase of swallowing.  No laryngeal
penetration or tracheobronchial aspiration.

Normal esophageal peristalsis.  No fixed esophageal narrowing or
stricture.  A 13 mm barium tablet passed into the stomach without
delay.

No hiatal hernia.  No evidence of gastroesophageal reflux.
IMPRESSION: Normal barium esophagram.

## 2017-05-20 ENCOUNTER — Other Ambulatory Visit: Payer: Self-pay

## 2017-05-20 ENCOUNTER — Emergency Department (HOSPITAL_BASED_OUTPATIENT_CLINIC_OR_DEPARTMENT_OTHER)
Admission: EM | Admit: 2017-05-20 | Discharge: 2017-05-20 | Disposition: A | Discharge status: Home / Self Care | Attending: Physician Assistant | Admitting: Physician Assistant

## 2017-05-20 ENCOUNTER — Encounter (HOSPITAL_BASED_OUTPATIENT_CLINIC_OR_DEPARTMENT_OTHER): Payer: Self-pay | Admitting: Emergency Medicine

## 2017-05-20 DIAGNOSIS — Y929 Unspecified place or not applicable: Secondary | ICD-10-CM | POA: Diagnosis not present

## 2017-05-20 DIAGNOSIS — Y998 Other external cause status: Secondary | ICD-10-CM | POA: Insufficient documentation

## 2017-05-20 DIAGNOSIS — Z79899 Other long term (current) drug therapy: Secondary | ICD-10-CM | POA: Diagnosis not present

## 2017-05-20 DIAGNOSIS — E039 Hypothyroidism, unspecified: Secondary | ICD-10-CM | POA: Insufficient documentation

## 2017-05-20 DIAGNOSIS — S76312A Strain of muscle, fascia and tendon of the posterior muscle group at thigh level, left thigh, initial encounter: Secondary | ICD-10-CM

## 2017-05-20 DIAGNOSIS — F17200 Nicotine dependence, unspecified, uncomplicated: Secondary | ICD-10-CM | POA: Diagnosis not present

## 2017-05-20 DIAGNOSIS — Y33XXXA Other specified events, undetermined intent, initial encounter: Secondary | ICD-10-CM | POA: Insufficient documentation

## 2017-05-20 DIAGNOSIS — Y93A9 Activity, other involving cardiorespiratory exercise: Secondary | ICD-10-CM | POA: Diagnosis not present

## 2017-05-20 DIAGNOSIS — S79922A Unspecified injury of left thigh, initial encounter: Secondary | ICD-10-CM | POA: Diagnosis present

## 2017-05-20 MED ORDER — METHOCARBAMOL 500 MG PO TABS: 500.0000 mg | ORAL_TABLET | Freq: Every evening | ORAL | 0 refills | Status: DC | PRN

## 2017-05-20 MED ORDER — IBUPROFEN 800 MG PO TABS: 800.0000 mg | ORAL_TABLET | Freq: Three times a day (TID) | ORAL | 0 refills | Status: DC

## 2017-05-20 MED FILL — IBUPROFEN 800 MG TAB: 800 | 7 days supply | Qty: 21 | Fill #0

## 2017-05-20 MED FILL — METHOCARBAMOL 500 MG TABLET: 500 | 20 days supply | Qty: 20 | Fill #0

## 2017-05-20 NOTE — Discharge Instructions (Addendum)
As discussed, you may experience muscle spasm and pain for the next week.The medicine prescribed can help with muscle spasm but cannot be taken if driving, with alcohol or operating machinery. You may take every 8 hours as needed if not working or driving. Otherwise take at bedtime. Ibuprofen 3 times a day.  Rice protocol as outlined in these instructions with ice for the first 48 hours.  Follow up with your Primary care provider or Dr. Pearletha ForgeHudnall sports medicine if symptoms persist beyond a week.  Return if worsening or new concerning symptoms in the meantime.

## 2017-05-20 NOTE — ED Triage Notes (Signed)
Pt was exercising, felt a pop in L upper leg, reports pain from L buttock down to knee. Pt took hydrocodone 7.5-325 1 hour PTA with some relief.

## 2017-05-20 NOTE — ED Provider Notes (Signed)
[facility] EMERGENCY DEPARTMENT Provider Note   CSN: [id number] Arrival date & time: 05/20/17  0841     History   Chief Complaint Chief Complaint  Patient presents with  . Leg Pain    HPI Vickie Terry is a 46 y.o. female with no pertinent past medical history presenting with sudden onset left posterior thigh pain after exercising prior to arrival. Patient reports that she felt a pop prior to onset. Her pain is aggravated by extending at the knee and alleviated by flexion. She has taken one of her husband's hydrocodone with some relief prior to arrival. She denies any numbness.  HPI  Past Medical History:  Diagnosis Date  . Depression   . Hypothyroid     Patient Active Problem List   Diagnosis Date Noted  . HYPOTHYROIDISM 04/23/2010  . IRREGULAR MENSES 04/23/2010  . FATIGUE 04/23/2010    Past Surgical History:  Procedure Laterality Date  . BREAST ENHANCEMENT SURGERY      OB History    No data available       Home Medications    Prior to Admission medications   Medication Sig Start Date End Date Taking? Authorizing Provider  buPROPion (WELLBUTRIN) 75 MG tablet Take 75 mg by mouth 2 (two) times daily.   Yes [provider]  LEVOTHYROXINE SODIUM PO Take by mouth.   Yes [provider]  lisdexamfetamine (VYVANSE) 20 MG capsule Take 20 mg by mouth daily.   Yes [provider]  Cyanocobalamin (VITAMIN B 12 PO) Take by mouth.    [provider]  ibuprofen (ADVIL,MOTRIN) 800 MG tablet Take 1 tablet (800 mg total) by mouth 3 (three) times daily. 05/20/17   Georgiana ShoreMitchell, Renay Crammer B, PA-C  methocarbamol (ROBAXIN) 500 MG tablet Take 1 tablet (500 mg total) by mouth at bedtime as needed for muscle spasms. 05/20/17   Mathews RobinsonsMitchell, Abbagale Goguen B, PA-C  SERTRALINE HCL PO Take by mouth.    [provider]    Family History No family history on file.  Social History Social History   Tobacco Use  . Smoking status: Current  Some Day Smoker  . Smokeless tobacco: Never Used  Substance Use Topics  . Alcohol use: No  . Drug use: No     Allergies   Patient has no known allergies.   Review of Systems Review of Systems  Constitutional: Negative for chills and fever.  Respiratory: Negative for cough, shortness of breath, wheezing and stridor.   Cardiovascular: Negative for chest pain and palpitations.  Gastrointestinal: Negative for nausea and vomiting.  Musculoskeletal: Positive for myalgias. Negative for arthralgias, back pain, joint swelling, neck pain and neck stiffness.  Skin: Negative for color change, pallor, rash and wound.  Neurological: Negative for syncope, weakness and numbness.     Physical Exam Updated Vital Signs BP 128/86 (BP Location: Right Arm)   Pulse 81   Temp 97.7 F (36.5 C) (Oral)   Resp 18   Ht 5\' 4"  (1.626 m)   Wt 61.7 kg (136 lb)   LMP 05/06/2017   SpO2 100%   BMI 23.34 kg/m   Physical Exam  Constitutional: She appears well-developed and well-nourished. No distress.  Afebrile, nontoxic-appearing, sitting in bed in mild discomfort.  HENT:  Head: Normocephalic and atraumatic.  Eyes: EOM are normal.  Neck: Normal range of motion.  Cardiovascular: Normal rate, regular rhythm, normal heart sounds and intact distal pulses.  No murmur heard. Pulmonary/Chest: Effort normal. No respiratory distress.  Musculoskeletal: Normal range of  motion. She exhibits tenderness. She exhibits no edema or deformity.  ttp of hamstring muscles on the left. Full rom at the knee and hip. No ttp of the hip or knee.  Neurological: She is alert. No sensory deficit. She exhibits normal muscle tone.  5/5 strength to flexion and extension at the knee, plantar flexion dorsiflexion bilaterally. NVI   Skin: Skin is warm and dry. No rash noted. She is not diaphoretic. No erythema. No pallor.  Psychiatric: She has a normal mood and affect.  Nursing note and vitals reviewed.    ED Treatments /  Results  Labs (all labs ordered are listed, but only abnormal results are displayed) Labs Reviewed - No data to display  EKG  EKG Interpretation None       Radiology No results found.  Procedures Procedures (including critical care time)  Medications Ordered in ED Medications - No data to display   Initial Impression / Assessment and Plan / ED Course  I have reviewed the triage vital signs and the nursing notes.  Pertinent labs & imaging results that were available during my care of the patient were reviewed by me and considered in my medical decision making (see chart for details).    Patient presenting with history and physical findings consistent with hamstring strain after exercises PTA. No other injuries, fall or trauma. No imaging indicated at this time. Rice protocol indicated and discussed with patient.  Ace wrap applied and crutches provided  Discharge home with symptomatic relief and close follow-up with PCP or sports medicine with Dr. Pearletha ForgeHudnall.  She is otherwise well with normal vital signs and stable.  Discussed strict return precautions and advised to return to the emergency department if experiencing any new or worsening symptoms. Instructions were understood and patient agreed with discharge plan.  Final Clinical Impressions(s) / ED Diagnoses   Final diagnoses:  Hamstring strain, left, initial encounter    ED Discharge Orders        Ordered    ibuprofen (ADVIL,MOTRIN) 800 MG tablet  3 times daily     05/20/17 0945    methocarbamol (ROBAXIN) 500 MG tablet  At bedtime PRN     05/20/17 0945       Georgiana ShoreMitchell, Erich Kochan B, PA-C 05/20/17 1001    Mackuen, Cindee Saltourteney Lyn, MD 05/20/17 1534

## 2019-10-09 ENCOUNTER — Other Ambulatory Visit: Payer: Self-pay

## 2019-10-09 ENCOUNTER — Encounter (HOSPITAL_COMMUNITY): Payer: Self-pay

## 2019-10-09 ENCOUNTER — Emergency Department (HOSPITAL_COMMUNITY)
Admission: EM | Admit: 2019-10-09 | Discharge: 2019-10-10 | Disposition: A | Discharge status: Home / Self Care | Attending: Emergency Medicine | Admitting: Emergency Medicine

## 2019-10-09 DIAGNOSIS — E039 Hypothyroidism, unspecified: Secondary | ICD-10-CM | POA: Diagnosis not present

## 2019-10-09 DIAGNOSIS — Z87891 Personal history of nicotine dependence: Secondary | ICD-10-CM | POA: Insufficient documentation

## 2019-10-09 DIAGNOSIS — R42 Dizziness and giddiness: Secondary | ICD-10-CM | POA: Diagnosis present

## 2019-10-09 DIAGNOSIS — T40711A Poisoning by cannabis, accidental (unintentional), initial encounter: Secondary | ICD-10-CM

## 2019-10-09 DIAGNOSIS — Z79899 Other long term (current) drug therapy: Secondary | ICD-10-CM | POA: Insufficient documentation

## 2019-10-09 DIAGNOSIS — R251 Tremor, unspecified: Secondary | ICD-10-CM | POA: Insufficient documentation

## 2019-10-09 DIAGNOSIS — T407X1A Poisoning by cannabis (derivatives), accidental (unintentional), initial encounter: Secondary | ICD-10-CM | POA: Insufficient documentation

## 2019-10-09 LAB — I-STAT BETA HCG BLOOD, ED (MC, WL, AP ONLY): I-stat hCG, quantitative: <5 m[IU]/mL (ref <5)

## 2019-10-09 LAB — CBC
HCT: 36 % (ref 36.0–46.0)
Hemoglobin: 12.1 g/dL (ref 12.0–15.0)
MCH: 34 pg (ref 26.0–34.0)
MCHC: 33.6 g/dL (ref 30.0–36.0)
MCV: 101.1 fL — ABNORMAL HIGH (ref 80.0–100.0)
Platelets: 211 10*3/uL (ref 150–400)
RBC: 3.56 MIL/uL — ABNORMAL LOW (ref 3.87–5.11)
RDW: 12.3 % (ref 11.5–15.5)
WBC: 6.6 10*3/uL (ref 4.0–10.5)
nRBC: 0 % (ref 0.0–0.2)

## 2019-10-09 MED ORDER — LORAZEPAM 2 MG/ML IJ SOLN
0.5000 mg | Freq: Once | INTRAMUSCULAR | Status: AC
  Administered 2019-10-09: 23:00:00 0.5 mg via INTRAVENOUS
  Filled 2019-10-09: qty 1

## 2019-10-09 NOTE — ED Notes (Signed)
Pt lying in bed unable to lie still. Full monitor on. Will continue to monitor.

## 2019-10-09 NOTE — ED Provider Notes (Signed)
[facility]-EMERGENCY DEPT Provider Note   CSN: [id number] Arrival date & time: 10/09/19  2153     History Chief Complaint  Patient presents with  . Dizziness  . Tremors    Vickie Terry is a 49 y.o. female.  HPI   Patient presents to the ED for evaluation after a reaction to ingesting THC.  Patient states her husband traveled to [place].  He purchased Nathaneil Canary which is THC infused water.  It has 100 mg.  Patient states she and her husband split the bottle.  Sometime after ingestion the patient started becoming dizzy and feeling tremulous.  She called 911.  Her husband is also coming to the emergency room..  Past Medical History:  Diagnosis Date  . Depression   . Hypothyroid     Patient Active Problem List   Diagnosis Date Noted  . HYPOTHYROIDISM 04/23/2010  . IRREGULAR MENSES 04/23/2010  . FATIGUE 04/23/2010    Past Surgical History:  Procedure Laterality Date  . BREAST ENHANCEMENT SURGERY       OB History   No obstetric history on file.     History reviewed. No pertinent family history.  Social History   Tobacco Use  . Smoking status: Former Games developer  . Smokeless tobacco: Never Used  Substance Use Topics  . Alcohol use: Yes  . Drug use: Yes    Types: Marijuana    Home Medications Prior to Admission medications   Medication Sig Start Date End Date Taking? Authorizing Provider  buPROPion (WELLBUTRIN) 75 MG tablet Take 75 mg by mouth 2 (two) times daily.    [provider]  Cyanocobalamin (VITAMIN B 12 PO) Take by mouth.    [provider]  ibuprofen (ADVIL,MOTRIN) 800 MG tablet Take 1 tablet (800 mg total) by mouth 3 (three) times daily. 05/20/17   Mathews Robinsons B, PA-C  LEVOTHYROXINE SODIUM PO Take by mouth.    [provider]  lisdexamfetamine (VYVANSE) 20 MG capsule Take 20 mg by mouth daily.    [provider]  methocarbamol (ROBAXIN) 500 MG tablet Take 1 tablet (500 mg total) by mouth at  bedtime as needed for muscle spasms. 05/20/17   Mathews Robinsons B, PA-C  SERTRALINE HCL PO Take by mouth.    [provider]    Allergies    Patient has no known allergies.  Review of Systems   Review of Systems  All other systems reviewed and are negative.   Physical Exam Updated Vital Signs BP 114/73   Pulse 94   Temp 98.1 F (36.7 C) (Oral)   Resp 13   Ht 1.626 m (5\' 4" )   SpO2 100%   BMI 23.34 kg/m   Physical Exam Vitals and nursing note reviewed.  Constitutional:      Appearance: She is well-developed. She is not diaphoretic.  HENT:     Head: Normocephalic and atraumatic.     Right Ear: External ear normal.     Left Ear: External ear normal.  Eyes:     General: No scleral icterus.       Right eye: No discharge.        Left eye: No discharge.     Conjunctiva/sclera: Conjunctivae normal.  Neck:     Trachea: No tracheal deviation.  Cardiovascular:     Rate and Rhythm: Normal rate and regular rhythm.  Pulmonary:     Effort: Pulmonary effort is normal. No respiratory distress.     Breath sounds: Normal breath sounds. No  stridor. No wheezing or rales.  Abdominal:     General: Bowel sounds are normal. There is no distension.     Palpations: Abdomen is soft.     Tenderness: There is no abdominal tenderness. There is no guarding or rebound.  Musculoskeletal:        General: No tenderness.     Cervical back: Neck supple.  Skin:    General: Skin is warm and dry.     Findings: No rash.  Neurological:     Mental Status: She is alert.     Cranial Nerves: No cranial nerve deficit (no facial droop, extraocular movements intact, no slurred speech).     Sensory: No sensory deficit.     Motor: No abnormal muscle tone or seizure activity.     Coordination: Coordination normal.     Comments: Akathisia, speech slightly slurred     ED Results / Procedures / Treatments   Labs (all labs ordered are listed, but only abnormal results are displayed) Labs  Reviewed  CBC - Abnormal; Notable for the following components:      Result Value   RBC 3.56 (*)    MCV 101.1 (*)    All other components within normal limits  I-STAT BETA HCG BLOOD, ED ([facility], [facility], [facility] ONLY)  I-STAT CHEM 8, ED    EKG None  Radiology No results found.  Procedures Procedures (including critical care time)  Medications Ordered in ED Medications  LORazepam (ATIVAN) injection 0.5 mg (0.5 mg Intravenous Given 10/09/19 2304)    ED Course  I have reviewed the triage vital signs and the nursing notes.  Pertinent labs & imaging results that were available during my care of the patient were reviewed by me and considered in my medical decision making (see chart for details).    MDM Rules/Calculators/A&P                      Patient presents to the ED after ingesting approximately 50 mg of THC.  This was purchased legally in [place] therefore very little concern for any contaminant.  Patient's husband is here with similar symptoms.  Poison control was contacted regarding his ingestion.  They recommended supportive care.  Patient's laboratory tests are unremarkable.  Plan to monitor in the ED until the [facility] has cleared and she is back to baseline.  Care turned over to oncoming team Final Clinical Impression(s) / ED Diagnoses Final diagnoses:  Marihuana (derivatives) overdose, accidental or unintentional, initial encounter      Dorie Rank, MD 10/10/19 [phone]

## 2019-10-09 NOTE — ED Triage Notes (Signed)
Pt brought in by EMS after ingesting half a bottle of "keef" which is THC infused water around 1900. Pt states she became dizzy and having tremors around 2000. Pt denies hx of Marijuana or illegal drug use.

## 2019-10-10 MED ORDER — SODIUM CHLORIDE 0.9 % IV BOLUS
1000.0000 mL | Freq: Once | INTRAVENOUS | Status: AC
Start: 2019-10-10 — End: 2019-10-10
  Administered 2019-10-10: 05:00:00 1000 mL via INTRAVENOUS

## 2019-10-10 NOTE — ED Notes (Signed)
Pt provided with breakfast tray.

## 2019-10-10 NOTE — ED Notes (Signed)
Pt lying in bed, eye's closed, chest rising and falling. Will continue to monitor.  

## 2019-10-10 NOTE — ED Notes (Signed)
Attempted to ambulate pt but upon sitting up on the bed pt stated she felt very dizzy which did not resolve at sitting on the side of the bed.

## 2019-10-10 NOTE — ED Notes (Signed)
Pt lying in bed, eye's closed, chest rising and falling. Monitor on. Will continue to monitor pt.

## 2019-10-10 NOTE — ED Provider Notes (Signed)
Care assumed from Dr. Lynelle Doctor, patient with accidental overdose of oral cannabis.  She has been observed in the ED and is now feeling better.  However, blood pressure is noted to be slightly low.  We will give IV fluids.  After IV fluids, she is feeling somewhat better, but still unsteady on her feet.  Case is signed out to Dr. Juleen China.  Patient will be able to be discharged once she is able to ambulate safely.   Dione Booze, MD 10/10/19 [phone]

## 2019-10-10 NOTE — ED Notes (Signed)
Discharge paperwork reviewed with pt. Pt with no questions or concerns at this time.

## 2019-10-10 NOTE — ED Notes (Signed)
Pt ambulatory with steady gait to bathroom and back.  Pt reports feeling better, almost back to baseline.

## 2019-10-10 NOTE — ED Notes (Signed)
Pt lying in bed, eyes closed, chest rising and falling. NAD noted. Will continue to monitor pt.

## 2020-02-05 ENCOUNTER — Telehealth: Payer: Self-pay | Admitting: Unknown Physician Specialty

## 2020-02-05 ENCOUNTER — Other Ambulatory Visit: Payer: Self-pay | Admitting: Unknown Physician Specialty

## 2020-02-05 ENCOUNTER — Encounter: Payer: Self-pay | Admitting: Unknown Physician Specialty

## 2020-02-05 DIAGNOSIS — U071 COVID-19: Secondary | ICD-10-CM

## 2020-02-05 DIAGNOSIS — E063 Autoimmune thyroiditis: Secondary | ICD-10-CM

## 2020-02-05 NOTE — Telephone Encounter (Signed)
I connected by phone with Vickie Terry on 02/05/2020 at 10:25 AM to discuss the potential use of a new treatment for mild to moderate COVID-19 viral infection in non-hospitalized patients.  This patient is a 49 y.o. female that meets the FDA criteria for Emergency Use Authorization of COVID monoclonal antibody casirivimab/imdevimab.  Has a (+) direct SARS-CoV-2 viral test result  Has mild or moderate COVID-19   Is NOT hospitalized due to COVID-19  Is within 10 days of symptom onset  Has at least one of the high risk factor(s) for progression to severe COVID-19 and/or hospitalization as defined in EUA.  Specific high risk criteria : BMI > 25   I have spoken and communicated the following to the patient or parent/caregiver regarding COVID monoclonal antibody treatment:  1. FDA has authorized the emergency use for the treatment of mild to moderate COVID-19 in adults and pediatric patients with positive results of direct SARS-CoV-2 viral testing who are 66 years of age and older weighing at least 40 kg, and who are at high risk for progressing to severe COVID-19 and/or hospitalization.  2. The significant known and potential risks and benefits of COVID monoclonal antibody, and the extent to which such potential risks and benefits are unknown.  3. Information on available alternative treatments and the risks and benefits of those alternatives, including clinical trials.  4. Patients treated with COVID monoclonal antibody should continue to self-isolate and use infection control measures (e.g., wear mask, isolate, social distance, avoid sharing personal items, clean and disinfect "high touch" surfaces, and frequent handwashing) according to CDC guidelines.   5. The patient or parent/caregiver has the option to accept or refuse COVID monoclonal antibody treatment.  After reviewing this information with the patient, The patient agreed to proceed with receiving casirivimab\imdevimab infusion  and will be provided a copy of the Fact sheet prior to receiving the infusion. [name] 02/05/2020 10:25 AM

## 2020-02-06 MED ORDER — SODIUM CHLORIDE 0.9 % IV SOLN
1200.0000 mg | Freq: Once | INTRAVENOUS | Status: AC
  Administered 2020-02-07: 1200 mg via INTRAVENOUS
  Filled 2020-02-06: qty 1200

## 2020-02-07 ENCOUNTER — Ambulatory Visit (HOSPITAL_COMMUNITY)
Admission: RE | Admit: 2020-02-07 | Discharge: 2020-02-07 | Disposition: A | Discharge status: Home / Self Care | Source: Ambulatory Visit | Attending: Pulmonary Disease | Admitting: Pulmonary Disease

## 2020-02-07 DIAGNOSIS — E063 Autoimmune thyroiditis: Secondary | ICD-10-CM | POA: Diagnosis not present

## 2020-02-07 DIAGNOSIS — U071 COVID-19: Secondary | ICD-10-CM | POA: Insufficient documentation

## 2020-02-07 NOTE — Progress Notes (Signed)
  Diagnosis: COVID-19  Physician:dr wright   Procedure: [facility] Med: casirivimab\imdevimab infusion - Provided patient with casirivimab\imdevimab fact sheet for patients, parents and caregivers prior to infusion.  Complications: No immediate complications noted.  Discharge: Discharged home   Shaune Spittle 02/07/2020

## 2020-02-07 NOTE — Discharge Instructions (Signed)
